# Patient Record
Sex: Male | Born: 1980 | Hispanic: Yes | Marital: Single | Smoking: Current every day smoker
Health system: Southern US, Community
[De-identification: ages and names within clinical notes are randomized; demographics above are authoritative.]

## PROBLEM LIST (undated history)

## (undated) DIAGNOSIS — M1990 Unspecified osteoarthritis, unspecified site: Secondary | ICD-10-CM

## (undated) HISTORY — DX: Unspecified osteoarthritis, unspecified site: M19.90

---

## 2013-09-16 ENCOUNTER — Ambulatory Visit: Payer: Self-pay | Admitting: Family Medicine

## 2013-09-16 VITALS — BP 148/86 | HR 73 | Temp 98.9°F | Resp 16 | Ht 65.0 in | Wt 182.0 lb

## 2013-09-16 DIAGNOSIS — L309 Dermatitis, unspecified: Secondary | ICD-10-CM

## 2013-09-16 DIAGNOSIS — L259 Unspecified contact dermatitis, unspecified cause: Secondary | ICD-10-CM

## 2013-09-16 DIAGNOSIS — M109 Gout, unspecified: Secondary | ICD-10-CM

## 2013-09-16 MED ORDER — PREDNISONE 20 MG PO TABS: ORAL_TABLET | ORAL | Status: DC

## 2013-09-16 MED ORDER — IBUPROFEN 800 MG PO TABS: 800.0000 mg | ORAL_TABLET | Freq: Three times a day (TID) | ORAL | Status: DC | PRN

## 2013-09-16 MED ORDER — OXYCODONE-ACETAMINOPHEN 5-325 MG PO TABS
1.0000 | ORAL_TABLET | Freq: Three times a day (TID) | ORAL | Status: DC | PRN
Start: 2013-09-16 — End: 2014-03-24

## 2013-09-16 MED ORDER — TRIAMCINOLONE ACETONIDE 0.1 % EX CREA: 1.0000 "application " | TOPICAL_CREAM | Freq: Two times a day (BID) | CUTANEOUS | Status: DC

## 2013-09-16 NOTE — Progress Notes (Signed)
Chief Complaint:  Chief Complaint  Patient presents with  . Rash    skin rash to right forarm and swollen left pinky for several days    HPI: Lonnie White is a 33 y.o. male who is here for 2 days of left hand swelling and pain, it is primarily in his left 5th finger. He denies any injuries. He has had similar sxs in the past on his other hand in his right hand at wrist and also fifth finger PIP. He deneis any injuries or trauma. He state he has not use anything. He works in [employer]. He has has pain in his knees as well. He denies fevers or chills, it just gets swollen and red and is painful and then eventually resolves.  He aslo has a rash on his arms that itches x 2 weeks. No new meds, travels, soaps, foods.. Denies n/w/t/ or excess ive alcohol use or shellfish intake.   Past Medical History  Diagnosis Date  . Arthritis    No past surgical history on file. History   Social History  . Marital Status: Single    Spouse Name: N/A    Number of Children: N/A  . Years of Education: N/A   Social History Main Topics  . Smoking status: Current Every Day Smoker  . Smokeless tobacco: Never Used     Comment: smokes 3 cigs per day  . Alcohol Use: No     Comment: social use  . Drug Use: No  . Sexual Activity: Not on file   Other Topics Concern  . Not on file   Social History Narrative  . No narrative on file   No family history on file. No Known Allergies Prior to Admission medications   Not on File     ROS: The patient denies fevers, chills, night sweats, unintentional weight loss, chest pain, palpitations, wheezing, dyspnea on exertion, nausea, vomiting, abdominal pain, dysuria, hematuria, melena, numbness, weakness, or tingling.   All other systems have been reviewed and were otherwise negative with the exception of those mentioned in the HPI and as above.    PHYSICAL EXAM: Filed Vitals:   09/16/13 1435  BP: 148/86  Pulse: 73  Temp: 98.9 F (37.2 C)    Resp: 16   Filed Vitals:   09/16/13 1435  Height: 5\' 5"  (1.651 m)  Weight: 182 lb (82.555 kg)   Body mass index is 30.29 kg/(m^2).  General: Alert, no acute distress HEENT:  Normocephalic, atraumatic, oropharynx patent. EOMI, PERRLA Cardiovascular:  Regular rate and rhythm, no rubs murmurs or gallops.  No Carotid bruits, radial pulse intact. No pedal edema.  Respiratory: Clear to auscultation bilaterally.  No wheezes, rales, or rhonchi.  No cyanosis, no use of accessory musculature GI: No organomegaly, abdomen is soft and non-tender, positive bowel sounds.  No masses. Skin: + eczema. Neurologic: Facial musculature symmetric. Psychiatric: Patient is appropriate throughout our interaction. Lymphatic: No cervical lymphadenopathy Musculoskeletal: Gait intact. Left hand + hand 5th finger swelling, tender, slight erythema. Decrease ROM due to pain, decrease grip strength due to swellign and pain He has fusiform swelling of the 4 and 5th DIP fingers.   LABS: No results found for this or any previous visit.   EKG/XRAY:   [facility]   ASSESSMENT/PLAN: Encounter Diagnoses  Name Primary?  . Gout Yes  . Dermatitis    Decline labs or xray since self pay so will presumptively treat for gout. He  has had similar sxs in diff parts of his body, including ankles, elbow, knees. He states they go away eventually.  He deneis any trauma,  history of RA in his family or STDs for himself Rx prednisone,roxicet, traimcinolone cream, ibuprofen prn F/u prn   Gross sideeffects, risk and benefits, and alternatives of medications d/w patient. Patient is aware that all medications have potential sideeffects and we are unable to predict every sideeffect or drug-drug interaction that may occur.  Lenell Antu, DO 09/16/2013 3:23 PM

## 2013-09-16 NOTE — Patient Instructions (Signed)
Lonnie White  °(Gout) ° Lonnie White es una artritis inflamatoria originada por Lonnie acumulación de cristales de ácido úrico en las articulaciones. El ácido úrico es una sustancia química que normalmente se encuentra en Lonnie sangre. Cuando los niveles de ácido úrico en Lonnie sangre son muy elevados, pueden formarse cristales que se depositan en las articulaciones y los tejidos. Esto causa irritación, dolor e hinchazón (inflamación). Lonnie repetición de los ataques es frecuente. Con el tiempo, los cristales de ácido úrico pueden formar masas (tofos) cerca de una articulación, destruyen el hueso y provocan una deformidad Lonnie White es una enfermedad que puede tratarse y con frecuencia puede prevenirse. °CAUSAS  °Lonnie enfermedad comienza con niveles altos de ácido úrico en Lonnie sangre. El organismo produce ácido úrico cuando metaboliza una sustancia que se encuentra en estado natural, denominada purina. Ciertos alimentos, como carnes y pescado, contienen grandes cantidades de purinas. Las causas de ácido úrico elevado son:  °· Ser transmitida de padres a hijos (hereditaria). °· Enfermedades que causan un aumento de Lonnie producción de ácido úrico (como Lonnie obesidad, Lonnie psoriasis y ciertos tipos de cáncer). °· Abuso en el consumo de bebidas alcohólicas. °· Lonnie dieta, especialmente las dietas en las que se consume mucha carne y frutos de mar. °· Ciertos medicamentos, como los que combaten el cáncer (quimioterapia), los diuréticos y Lonnie aspirina. °· Enfermedades renales crónicas. Los riñones no pueden eliminar bien el ácido úrico. °· Problemas con el metabolismo. °Las enfermedades fuertemente asociadas a Lonnie White son:  °· Obesidad. °· Hipertensión arterial. °· Colesterol elevado. °· Diabetes. °No todas las personas con niveles elevados de ácido úrico sufren Lonnie White. No se comprende aún por qué algunas personas padecen Lonnie White y otras no. Las cirugías, lesiones en una articulación y el consumo excesivo de ciertos alimentos son algunos de los factores que pueden  provocar ataques de Lonnie White.  °SÍNTOMAS  °· Un ataque de Lonnie White puede comenzar rápidamente. Causa un dolor intenso, con irritación, hinchazón y calor en una articulación. °· Puede haber fiebre. °· Generalmente sólo una articulación es afectada. Ciertas articulaciones se ven implicadas con más frecuencia. °· Lonnie base del dedo gordo del pie. °· Lonnie rodilla. °· El tobillo. °· Lonnie muñeca. °· Un dedo. °Sin tratamiento, un ataque por lo general desaparece en unos pocos días o semanas. Entre un ataque y otro, por lo general no hay síntomas, lo cual es diferente de muchas otras formas de artritis.  °DIAGNÓSTICO  °El profesional reunirá Lonnie información basándose en los síntomas y el examen físico. En algunos casos indicará estudios. Estos estudios pueden ser:  °· Análisis de sangre. °· Análisis de orina. °· Radiografías. °· Análisis de los fluidos de Lonnie articulación. En este estudio se necesita una aguja para extraer líquido de Lonnie articulación (artrocentesis). Con el uso del microscopio, se confirma Lonnie White cuando se observan los cristales de ácido úrico en el líquido de Lonnie articulación. °TRATAMIENTO  °Hay dos fases en el tratamiento de Lonnie White: el tratamiento del ataque de aparición repentina (agudo) y Lonnie prevención de los ataques (profilaxis).  °· Tratamiento de un ataque agudo. °· Se utilizan medicamentos. Se indican antiinflamatorios o corticoides. °· En algunos casos es necesario inyectar un corticoide en Lonnie articulación afectada. °· Lonnie articulación dolorosa se pone en reposo. El movimiento puede empeorar Lonnie artritis. °· Puede utilizar tanto tratamientos con calor o con frío para aliviar el dolor en las articulaciones, según lo que Lonnie White haga mejor. °· Tratamiento para prevenir ataques. °· Si sufre de ataques de Lonnie White frecuentes,   el médico puede recomendarle medicamentos preventivos. Estos medicamentos se administran después de que el ataque agudo mejora. Estos medicamentos ayudan a los riñones a eliminar el ácido úrico del  organismo o a disminuir Lonnie producción de ácido úrico. Es posible que deba utilizar estos medicamentos durante un largo tiempo. °· Lonnie primera fase del tratamiento preventiva puede asociarse con un aumento de los ataques agudos de Lonnie White. Por esta razón, durante los primeros meses de tratamiento, su médico puede también aconsejarle que tome medicamentos que habitualmente se utilizan para el tratamiento de Lonnie White aguda. Asegúrese de comprender todas las indicaciones del médico. El médico podrá hacer varios ajustes a Lonnie dosis del medicamento antes de que comiencen a ser efectivos. °· Comente el tratamiento dietético con su médico o nutricionista. El alcohol y las bebidas que contienen gran cantidad de azúcar y fructosa y los alimentos como Lonnie carne, el pollo y los frutos de mar pueden aumentar los niveles de ácido úrico. El médico o el nutricionista podrá aconsejarlo sobre las bebidas y los alimentos que debe limitar. °INSTRUCCIONES PARA EL CUIDADO EN EL HOGAR  °· No tome aspirina para el dolor. Esto eleva los niveles de ácido úrico. °· Sólo tome medicamentos de venta libre o prescriptos para calmar el dolor, las molestias o bajar Lonnie fiebre según las indicaciones de su médico. °· Haga reposo todo el tiempo que pueda. Cuando se encuentre en Lonnie cama, mantenga las sábanas y mantas alejadas de las articulaciones doloridas. °· Mantenga Lonnie articulación afectada levantada (elevada). °· Aplique compresas frías o calientes sobre las articulaciones doloridas. el uso de compresas calientes o frías depende de lo que mejor Lonnie White resulte a usted. °· Utilice muletas si Lonnie articulación que Lonnie White duele es de Lonnie pierna. °· Debe ingerir gran cantidad de líquido para mantener Lonnie orina de tono claro o color amarillo pálido. Esto ayudará a que el organismo elimine el ácido úrico. Limite el consumo de alcohol, bebidas azucaradas y que contengan fructosa. °· Siga las indicaciones con respecto a Lonnie dieta. Preste especial atención a Lonnie cantidad de  proteínas que consume. En Lonnie dieta diaria debe enfatizar el consumo de frutas, vegetales, granos enteros y productos lácteos descremados. Comente con su médico o nutricionista acerca del consumo de café, vitamina C o cerezas. Estos pueden ayudar a disminuir los niveles de ácido úrico. °· Mantenga un peso corporal adecuado. °SOLICITE ATENCIÓN MÉDICA SI:  °· Tiene diarrea, vómitos o algún efecto secundario provocado por los medicamentos. °· No se siente mejor en 24 horas, o empeora. °SOLICITE ATENCIÓN MÉDICA DE INMEDIATO SI:  °· Las articulaciones Hipolito Martinezlopez duelen más de manera repentina, tiene escalofríos o fiebre. °ASEGÚRESE DE QUE:  °· Comprende estas instrucciones. °· Controlará su enfermedad. °· Solicitará ayuda de inmediato si no mejora o si empeora. °Document Released: 01/14/2005 Document Revised: 08/01/2012 °[employer]® Patient Information ©2014 [employer]. ° °

## 2014-03-24 ENCOUNTER — Ambulatory Visit (INDEPENDENT_AMBULATORY_CARE_PROVIDER_SITE_OTHER): Payer: Self-pay

## 2014-03-24 ENCOUNTER — Ambulatory Visit (INDEPENDENT_AMBULATORY_CARE_PROVIDER_SITE_OTHER): Payer: Self-pay | Admitting: Family Medicine

## 2014-03-24 VITALS — BP 132/80 | HR 70 | Temp 97.9°F | Resp 16 | Ht 66.5 in | Wt 185.0 lb

## 2014-03-24 DIAGNOSIS — S86811A Strain of other muscle(s) and tendon(s) at lower leg level, right leg, initial encounter: Secondary | ICD-10-CM

## 2014-03-24 DIAGNOSIS — M25561 Pain in right knee: Secondary | ICD-10-CM

## 2014-03-24 DIAGNOSIS — S86911A Strain of unspecified muscle(s) and tendon(s) at lower leg level, right leg, initial encounter: Secondary | ICD-10-CM

## 2014-03-24 MED ORDER — HYDROCODONE-ACETAMINOPHEN 5-325 MG PO TABS: 1.0000 | ORAL_TABLET | Freq: Four times a day (QID) | ORAL | Status: AC | PRN

## 2014-03-24 MED ORDER — NAPROXEN 500 MG PO TABS: 500.0000 mg | ORAL_TABLET | Freq: Two times a day (BID) | ORAL | Status: AC

## 2014-03-24 NOTE — Progress Notes (Addendum)
Subjective:    Patient ID: Lonnie White, male    DOB: 02/04/1981, 33 y.o.   MRN: [id number]  HPI Chief Complaint  Patient presents with   Knee Pain    right knee;  x2 days   Medication Refill   Naproxen, Hydrocodone,   This chart was scribed for Nilda SimmerKristi Smith, MD by Andrew Auaven Small, ED Scribe. This patient was seen in room 9 and the patient's care was started at 11:03 AM.  HPI Comments: Lonnie White is a 33 y.o. male who presents to the [facility] complaining of right knee pain that began 1 day ago. Pt reports he has pain with elevated uric acid levels/gout usually in his right knee, left knee or left shoulder. Pt states he was taking medication for elevated uric acid but states he has been out of his medication for 3 months. He reports levels were last checked 3 months ago in [place]. Pt reports popping in his knee with rest, the knee giving out, and limping with walking.  He reports worsening pain with walking. He reports he's had a negative right knee XRay in the past.  Pt was prescribed colchicine and naproxen 9/11 and 9/13. Pt denies exercising.  Pt walks up and down a tall ladder multiple times per day at work; this worsens knee pain.   Pt works [employer]. Pt denies other health problems. He denies surgeries.   History reviewed. No pertinent past medical history. No Known Allergies Prior to Admission medications   Medication Sig Start Date End Date Taking? Authorizing Provider  colchicine 0.6 MG tablet Take 0.6 mg by mouth daily.   Yes Historical Provider, MD  HYDROcodone-acetaminophen (NORCO/VICODIN) 5-325 MG per tablet Take 1 tablet by mouth every 6 (six) hours as needed for moderate pain.   Yes Historical Provider, MD  naproxen (NAPROSYN) 500 MG tablet Take 500 mg by mouth 2 (two) times daily with a meal.   Yes Historical Provider, MD   Review of Systems  Constitutional: Negative for fever, chills, diaphoresis and fatigue.  Musculoskeletal:  Positive for arthralgias and gait problem. Negative for myalgias and joint swelling.  Skin: Negative for color change, pallor, rash and wound.  Neurological: Negative for weakness and numbness.       Objective:   Physical Exam  Constitutional: He is oriented to person, place, and time. He appears well-developed and well-nourished. No distress.  HENT:  Head: Normocephalic and atraumatic.  Eyes: Conjunctivae and EOM are normal. Pupils are equal, round, and reactive to light.  Musculoskeletal: Normal range of motion.       Right shoulder: He exhibits tenderness and bony tenderness. He exhibits normal range of motion, no swelling, no effusion, no pain, normal pulse and normal strength.       Right knee: He exhibits no effusion and no erythema. Tenderness found. Lateral joint line tenderness noted.  Right knee without effusion, warmth or erythema. Full flexion and extension. TTP lateral joint line. Mcmurray negative. valgus and varus intact. Pt walks with a limp.  Lachman's negative.  Neurological: He is alert and oriented to person, place, and time.  Skin: Skin is warm and dry. No rash noted. No erythema.  Psychiatric: He has a normal mood and affect. His behavior is normal.  Nursing note and vitals reviewed.   [facility] reading (PRIMARY) by  Dr. Katrinka BlazingSmith.  R KNEE: PATELLAR SPURRING.       Assessment & Plan:  Right knee pain - Plan: DG Knee Complete 4 Views  Right, CANCELED: DG Knee 1-2 Views Right  Knee strain, right, initial encounter   1. R lateral knee pain/strain: New.  Current symptoms not suggestive of gout but instead of acute strain.  Rx for Naproxen 500mg  bid provided; rx for hydrocodone provided. If no improvement in two weeks, call for ortho referral.  Home exercise program provided.  Recommend rest, ice, elevation.    Meds ordered this encounter  Medications   DISCONTD: naproxen (NAPROSYN) 500 MG tablet    Sig: Take 500 mg by mouth 2 (two) times daily with a meal.    DISCONTD: HYDROcodone-acetaminophen (NORCO/VICODIN) 5-325 MG per tablet    Sig: Take 1 tablet by mouth every 6 (six) hours as needed for moderate pain.   colchicine 0.6 MG tablet    Sig: Take 0.6 mg by mouth daily.   naproxen (NAPROSYN) 500 MG tablet    Sig: Take 1 tablet (500 mg total) by mouth 2 (two) times daily with a meal.    Dispense:  60 tablet    Refill:  2   HYDROcodone-acetaminophen (NORCO/VICODIN) 5-325 MG per tablet    Sig: Take 1 tablet by mouth every 6 (six) hours as needed for moderate pain.    Dispense:  30 tablet    Refill:  0   I personally performed the services described in this documentation, which was scribed in my presence. The recorded information has been reviewed and considered.   Nilda SimmerKristi Smith, M.D.  [facility]   [place] [address] [place], [place]  [id number] [phone] phone [phone] fax

## 2014-03-24 NOTE — Patient Instructions (Signed)
Fractura de meniscos con rehabilitacin fase I (Meniscus Tear with Phase I Rehab) El menisco es un trozo de TEFL teachercartlago con forma de C, ubicado en la articulacin de la rodilla, entre la tibia y Writerel fmur. Hay dos meniscos en cada articulacin de la rodilla: interno y externo. El menisco acta como un adaptador entre la tibia y el fmur y permite que los huesos de la tibia y el fmur se ensamblen. Tambin funciona como amortiguador, para reducir el estrs sobre la articulacin de la rodilla y ayudar a suministrar nutrientes al cartlago de Nurse, learning disabilityla articulacin. Cuando las Lockheed Martinpersonas envejecen, los meniscos se endurecen y se vuelven ms vulnerables a las lesiones. Es una lesin frecuente, especialmente en los deportistas de Firebaughedad avanzada. La fractura de menisco interno es ms comn que la del externo.  SNTOMAS  Dolor en la rodilla, especialmente al permanecer parado sobre la pierna afectada.  Sensibilidad en la lnea de la articulacin.  Inflamacin en la articulacin de la rodilla (efusin), que por lo general comienza entre 1 y 2 [address]  [address] despus de la lesin.  La rodilla se traba, o se queda atrapada la articulacin, lo que ocasiona que no se pueda estirar por completo.  La rodilla se tuerce o se afloja. CAUSAS La ruptura se produce cuando se aplica una fuerza mayor que la que puede soportar el menisco. Las causas ms frecuentes de la lesin son:  Golpe directo en la rodilla (traumatismo).  Golpe directo en la rodilla, girar, pivotar o cortar (cambiar rpidamente de direccin al correr), as Brayton Marscomo al arrodillarse o ponerse en cuclillas.  Sin traumatismo, por un proceso de envejecimiento. LOS RIESGOS AUMENTAN CON:  En los deportes de contacto (ftbol americano, rugby).  Los deportes en los cuales se requiere Comptrollercalzado especial (ftbol, lacrosse),o en los que se requieren tapones grandes y que requieren cambios bruscos de direccin (squash, deportes con raqueta, bsquet).  Lesin previa en la  rodilla.  Lesin asociada en la rodilla, en particular lesiones en los ligamentos.  Poca fuerza y flexibilidad. PREVENCIN   Precalentamiento adecuado y elongacin antes de la [place] Heightsactividad.  Mantener la forma fsica:  Earma ReadingFuerza, flexibilidad y resistencia muscular.  Capacidad cardiovascular.  Proteja la rodilla con dispositivos ortopdicos o vendajes compresivos.  Utilice el equipo adecuado (tamao adecuado de los tapones segn la superficie). PRONSTICO Estas fracturas a menudo se curan por s solas. Sin embargo, a menudo requiere Azerbaijanciruga y 6 meses de recuperacin.  COMPLICACIONES RELACIONADAS  La recurrencia de los sntomas puede dar como resultado un problema crnico.  Traumatismo repetido de la rodilla, en especial si se retoma el deporte rpidamente luego de la lesin o la ciruga.  Progresin del desgarro (se agranda) si no se trata.  Artritis en la rodilla luego de algunos aos (con o sin Azerbaijanciruga).  Complicaciones en la ciruga que incluyen infeccin, hemorragia, lesiones a los nervios (adormecimiento, debilitamiento, parlisis) dolor continuo, se tuerce o se traba la rodilla, el menisco no se cura (si fue reparado), necesidad de [place] nueva ciruga, y rigidez de la rodilla (prdida de movimiento). TRATAMIENTO El tratamiento inicial incluye el uso de medicamentos y la aplicacin de hielo para reducir Chief Technology Officerel dolor y la inflamacin. La utilizacin de Murphy Oilmuletas podr ser de Blairutilidad. Sin embargo Careers information officerpuede sostener peso con la pierna afectada, si el dolor se lo permite. La ciruga se recomienda a menudo como tratamiento definitivo. La ciruga suele realizarse a travs de una incisin cerca de la articulacin (artroscopa). Se elimina la parte desgarrada del menisco y si es posible se repara  el cartlago. Luego de la [place], se inmoviliza la articulacin. Luego de la inmovilizacin es importante completar los ejercicios de fortalecimiento y elongacin para recobrar la fuerza y la amplitud de  movimientos. Los ejercicios pueden Press photographer o con un terapeuta.  MEDICAMENTOS   Si es necesaria la administracin de medicamentos para Conservation officer, historic buildings, se recomiendan los antiinflamatorios no esteroides, como aspirina e ibuprofeno y otros calmantes menores, como acetaminofeno.  No tome medicamentos para el dolor dentro de los 7 das previos a la [place].  El profesional podr prescribirle calmantes si lo considera necesario. Utilcelos como se le indique y slo cuando lo necesite. CALOR Y FRO   El fro (con hielo) debe aplicarse durante 10 a 15 minutos cada 2  3 horas para reducir la inflamacin y Conservation officer, historic buildings e inmediatamente despus de cualquier actividad que agrava los sntomas. Utilice bolsas o un masaje de hielo.  El calor puede usarse antes de Neurosurgeon y [place] fortalecimiento indicadas por el profesional, le fisioterapeuta o Industrial/product designer. Utilice una bolsa trmica o un pao hmedo. SOLICITE ATENCIN MDICA SI:  Los sntomas empeoran o no mejoran en 2 semanas, a pesar de Chiropodist.  Desarrolla nuevos e inexplicables sntomas. (Los medicamentos indicados en el tratamiento le ocasionan efectos secundarios). [place]ield del menisco, no quirrgico, Fase I Estos son algunos ejercicios iniciales con los que puede comenzar el programa de rehabilitacin hasta que vea nuevamente al profesional que lo asiste o hasta que los sntomas hayan desaparecido. Recuerde:   Los ejercicios iniciales deben ser suaves. Le ayudarn a reestablecer el movimiento sin aumentar la hinchazn.  Al completar estos ejercicios podr realizar movimientos con menos dolor y estar preparado para ejercicios de fortalecimiento ms agresivos de la Fase II.  Para que sea efectiva, cada elongacin debe realizarse durante al menos 30 segundos.  La elongacin nunca debe ser dolorosa. Deber sentir slo un alargamiento o distensin  suave del tejido que estira. [place], activo  Recustese sobre la espalda con las rodillas rectas. (Si esto le ocasiona molestias en la espalda, doble la rodilla opuesta, apoyando el pie en el suelo).  Lentamente deslice el taln hacia sus nalgas, hasta que sienta una sensacin de estiramiento en la zona de la rodilla o el muslo.  Mantenga esta posicin durante __________ segundos. Vuelva lentamente el taln hasta la posicin inicial. Reptalo __________ veces. Realice este ejercicio __________ veces por da.  [place] y extensin de la rodilla, [place], asistida  Sintese en el borde de una mesa o una silla, con los muslos bien apoyados. Puede ser de utilidad colocar una toalla doblada debajo de su muslo derecha / izquierdo.  Flexin (doblar): Coloque el tobillo de su pierna sana sobre el otro tobillo. Use su pierna sana para doblar suavemente su rodilla derecha / izquierdo hasta sentir una suave tensin que cruce sobre su rodilla.  Mantenga esta posicin durante __________ segundos.  Extensin (elongacin): [place] posicin de los tobillos de modo que la pierna Recruitment consultant / izquierdo [place]. Use su pierna sana para doblar suavemente su rodilla derecha / izquierdo hasta sentir una suave tensin que cruce sobre su rodilla.  Mantenga esta posicin durante __________ segundos. Reptalo __________ veces. Realice este ejercicio __________ veces por da. ELONGACIN - Flexin de la rodilla posicin supina  Acustese en el suelo, con el taln derecha / izquierdo tocando ligeramente la pared. (Coloque ambos  pies en la pared, si no utiliza un marco de la puerta.)  Sin hacer esfuerzo, permita que la gravedad haga descender su pie por la pared lentamente hasta sentir un ligero estiramiento en la zona anterior de la rodilla derecha / izquierdo.  Mantenga esta posicin durante __________ segundos. Luego vuelva la pierna a la posicin  inicial usando su pierna sana, si es necesario. Reptalo __________ veces. Realice este estiramiento __________ Vicenta Aly por da.  ELONGACIN - Extensin de la rodilla en posicin sentado  Sintese con su pierna o taln derecha / izquierdo apoyado en otra silla, en una mesita de caf o en un posapis.  Relaje los msculos de la pierna, y permita que la gravedad enderece su rodilla.*  Debe sentir un estiramiento en la rodilla derecha / izquierdo. Mantenga esta posicicin durante __________ segundos. Reptalo __________ veces. Realice este estiramiento __________ Vicenta Aly por da.  *El mdico, fisioterapeuta o Administrator, sports, podrn indicarle que aada __________ Marlyne Beards de resistencia mediante la colocacin de un objeto pesado sobre el muslo, por encima de rtula para profundizar el estiramiento.  EJERCICIOS DE FORTALECIMIENTO - Fractura de meniscos, no quirrgico Fase I Estos ejercicios le ayudarn en la recuperacin de la lesin. Los sntomas podrn desaparecer con o sin mayor intervencin del profesional, el fisioterapeuta o Industrial/product designer. Al completar estos ejercicios, recuerde:   Los msculos pueden ganar la resistencia y la fuerza necesarias para las actividades diarias a travs de ejercicios controlados.  Realice los ejercicios como se lo indic el mdico, el fisioterapeuta o Industrial/product designer. Aumente la resistencia y las repeticiones segn se le haya indicado. FUERZA - Cudriceps, isomtricos  Recustese sobre la espalda con la pierna derecha / izquierdo extendida y la rodilla opuesta flexionada.  Tensione gradualmente los msculos de la zona anterior del muslo derecha / izquierdo. Ver que la rtula se desliza hacia arriba o que se intensifica el hoyuelo que se encuentra justo por arriba de la rodilla. Este movimiento empujar nuevamente la rodilla hacia abajo en direccin al piso, [place] o cama sobre la cual est recostado.  Sostenga de ese modo el msculo, tan apretado como pueda, sin que [employer], durante __________ segundos.  Relaje los msculos lenta y completamente entre cada repeticin. Reptalo __________ veces. Realice este ejercicio __________ veces por da.  FUERZA - Cudriceps, arcos cortos  Acustese [employer] Coloque un rollo de __________ cm. debajo de la rodilla derecha / izquierdo para que las rodillas se doblen levemente.  Eleve slo la parte inferior de la pierna mediante la tensin de los msculos de la parte frontal del muslo. No permita que el muslo se eleve.  Mantenga esta posicicin durante __________ segundos. Reptalo __________ veces. Realice este ejercicio __________ veces por da.  PESO OPCIONAL EN EL TOBILLO: Comience con ____________________, pero no se exceda de ____________________. Aumente de a 1 libra/0.5 kilograme.  Roland piernas rectas Lo que importa es la calidad! Observe si hay seales de que el msculo cudriceps est trabajando, para estar seguro de que trabaja el fortalecimiento de los msculos correctos y no "haga trampa" mediante la sustitucin de los msculos sanos.  Recustese sobre la espalda con su pierna derecha / izquierdo extendida y la rodilla opuesta doblada.  Tensione gradualmente los msculos de la zona anterior del muslo derecha / izquierdo. Ver que la rtula se desliza hacia arriba o que se intensifica el hoyuelo que se encuentra justo por arriba de la rodilla. El muslo podr temblar un poco.  Tensione  estos msculos an ms y Borders Groupeleve la pierna 10 a 15 cm del piso. Mantenga esta posicin durante __________ segundos.  Mientras mantiene estos msculos tensionados, baje la pierna.  Relaje los msculos lenta y completamente entre cada repeticin. Reptalo __________ veces. Realice este ejercicio __________ veces por da.  FUERZA - Isquiosurales - giros  Acustese sobre el estmago con las piernas extendidas. (Si est recostado Whole Foodssobre la cama, puede dejar que los pies cuelguen por el  borde).  Contraiga los msculos de la zona posterior del muslo para doblar su rodilla derecha / izquierdo a 90 grados. Mantenga las caderas planas sobre la cama.  Mantenga esta posicicin durante __________ segundos.  Baje lentamente la pierna hasta la posicin inicial. Reptalo __________ veces. Realice este ejercicio __________ veces por da.  FUERZA - Cudriceps, cuclillas  Prese en el marco de la puerta, de modo que los pies y las rodillas queden en lnea con el marco.  Coloque las manos para mantener el equilibrio en el [place]marco, pero no se apoye.  Bjese lentamente, doblando las caderas y las rodillas. Mantenga las piernas rectas de modo que queden paralelas al marco de la puerta. Pngase en cuclillas slo dentro del lmite en que no sienta dolor en la rodilla. Nunca deje que las caderas lleguen a un nivel inferior de las rodillas.  Vuelva lentamente a la posicin erguida, empujando con las piernas y no con las manos Reptalo __________ Darden Restaurantsveces. Realice este ejercicio __________ veces por da.  FUERZA - Isometra cudriceps vasto medial oblicuo  Sintese en una silla con su rodilla derecha / izquierdo ligeramente doblada. Con la punta de los dedos sienta el vasto medial oblicuo arriba de la zona interna de la rodilla. Este msculo interviene en el control de la posicin de la rtula.  Mantenga la punta de los dedos Sears Holdings Corporationsobre este msculo. Sin mover la pierna, intente llevar la rodilla hacia abajo como si extendiera la pierna. Debe sentir que el vasto medial oblicuo se tensa. Si le resulta difcil, trate primero de hacer el mismo ejercicio con la rodilla sana.  Tense este msculo tanto como pueda sin aumentar el dolor en la rodilla.  Mantenga esta posicin durante __________ segundos. Relaje los msculos lenta y completamente entre cada repeticin. Reptalo __________ veces. Realice este ejercicio __________ veces por da.  Document Released: 01/21/2006 Document Revised:  06/29/2011 [facility]roeExitCare Patient Information 2015 [employer], Maryland[place]. This information is not intended to replace advice given to you by your health care provider. Make sure you discuss any questions you have with your health care provider.

## 2015-05-25 IMAGING — CR DG KNEE COMPLETE 4+V*R*
4 series · 4 of 4 positions shown · non-contrast
Comparison: No priors.

CLINICAL DATA: 33-year-old male presenting with right-sided knee
pain.

EXAM:
RIGHT KNEE - COMPLETE 4+ VIEW

[AP]
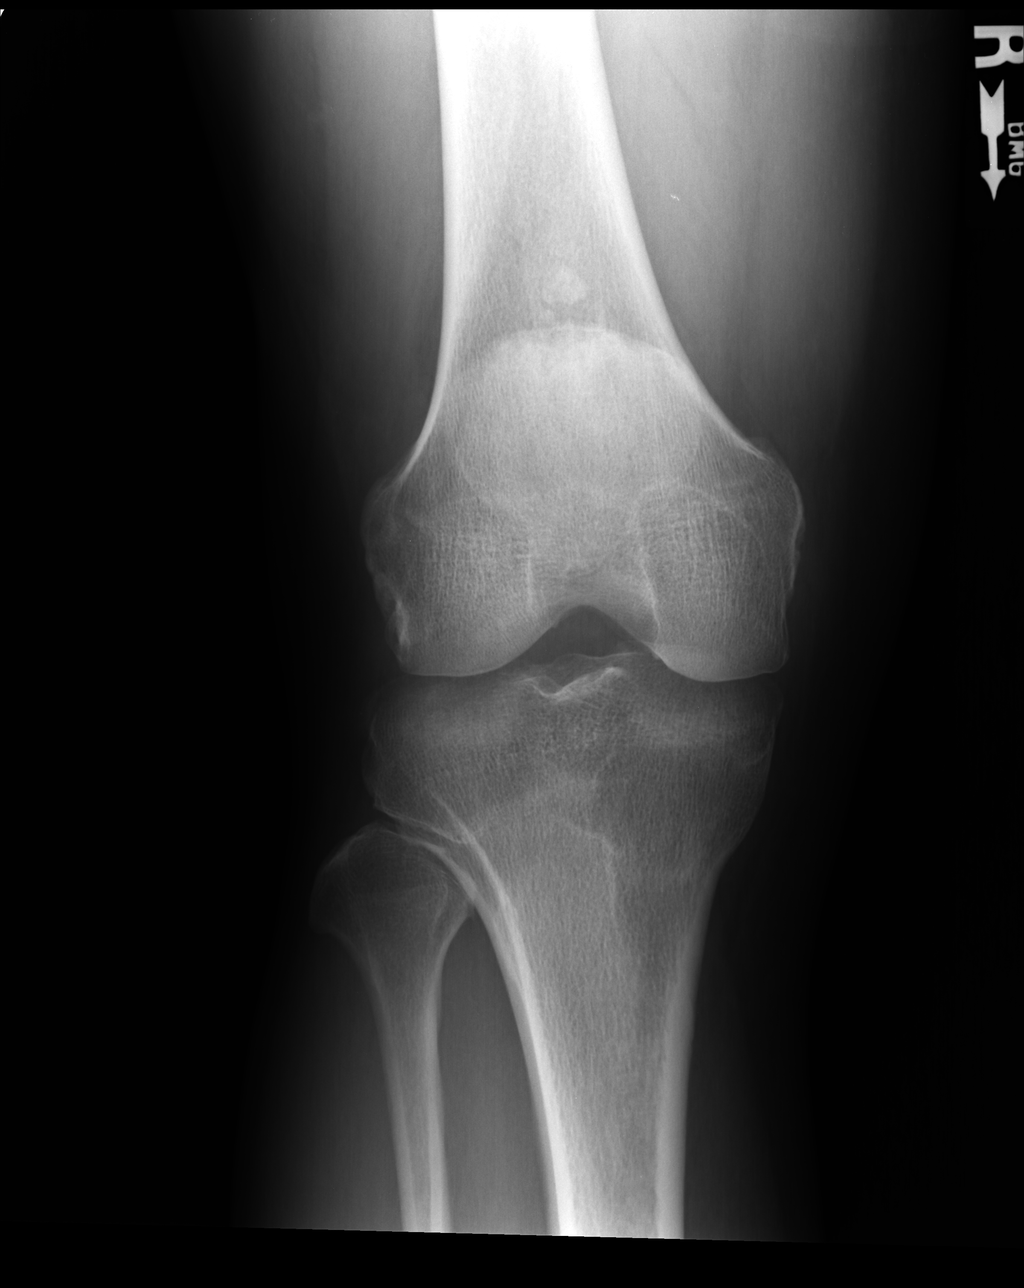

[ap axial]
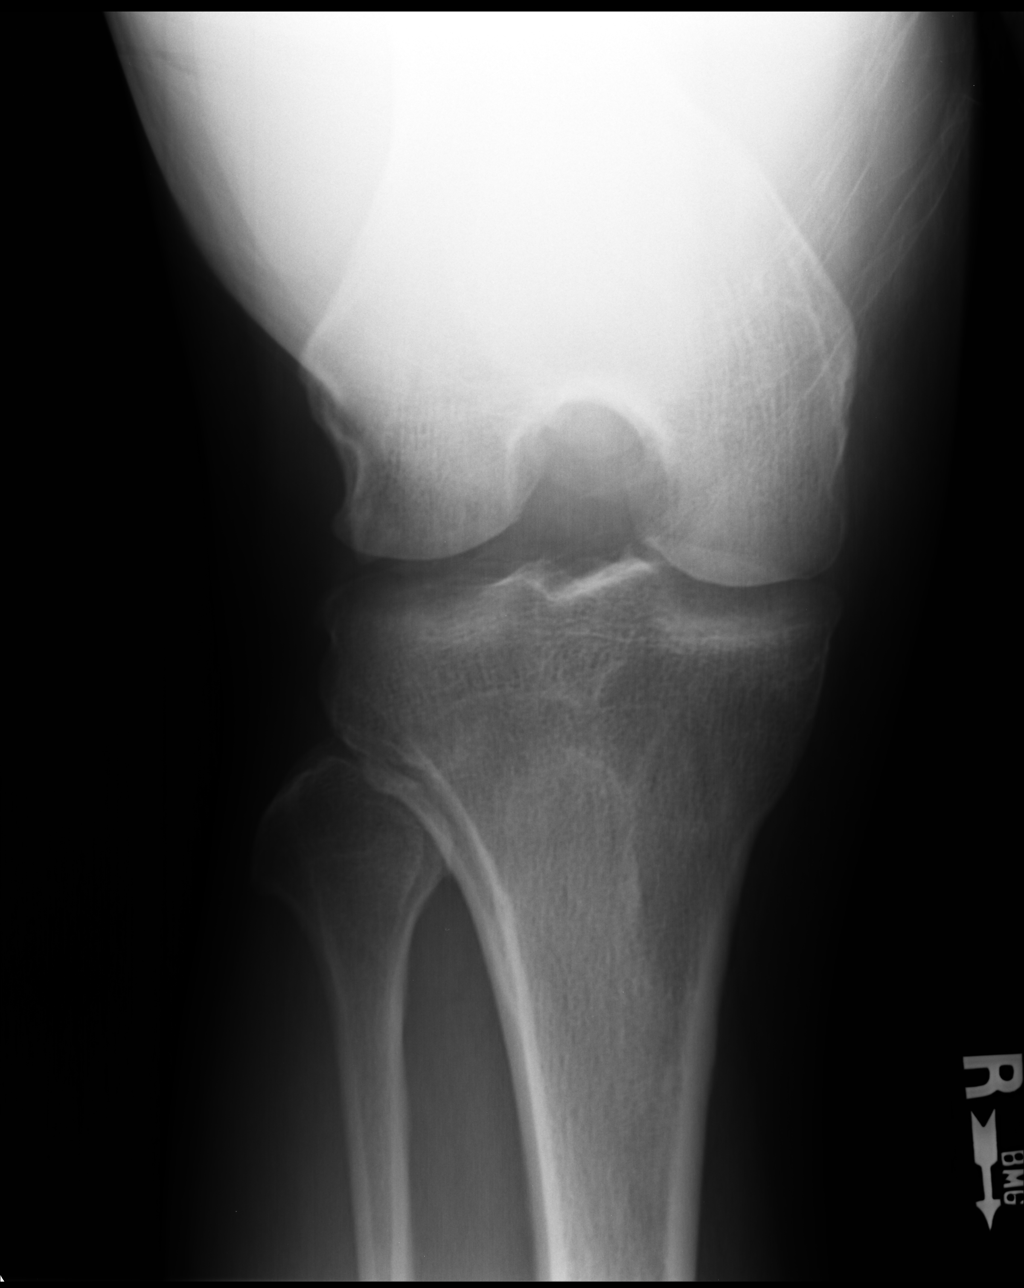

[lateral]
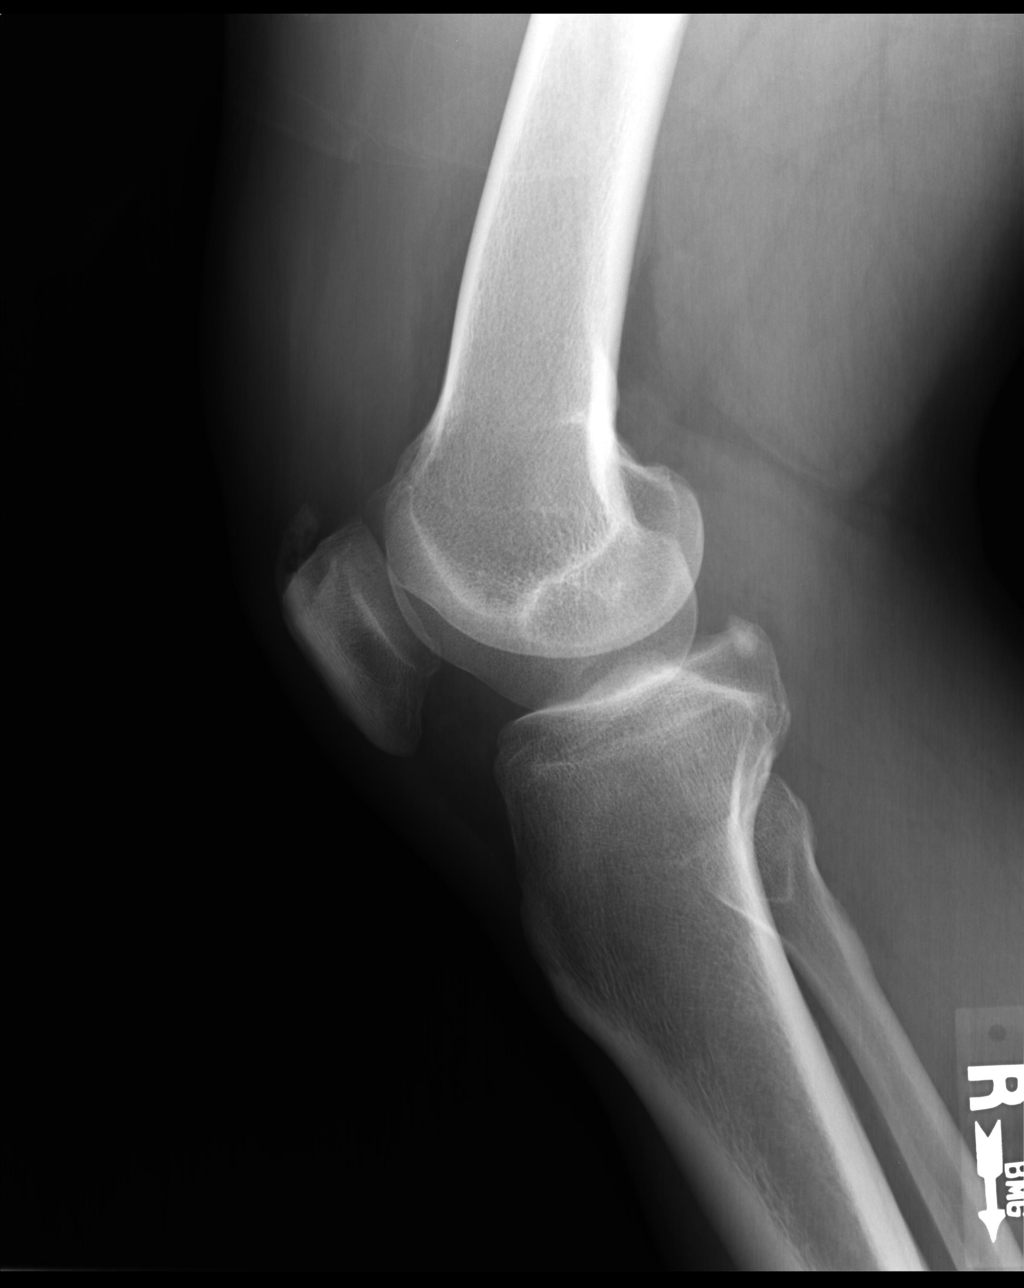

[sunrise]
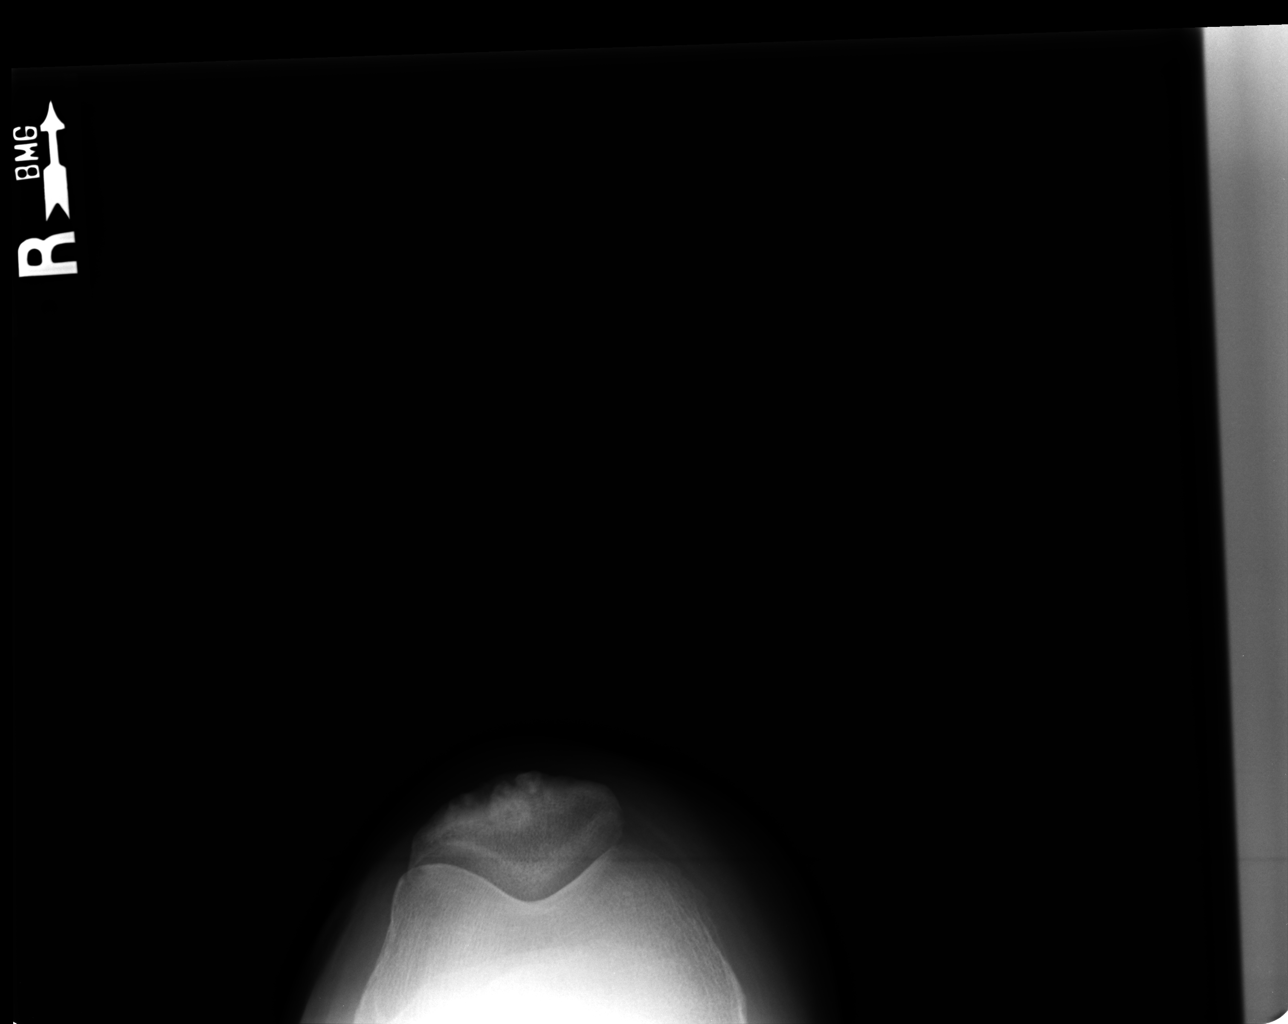

[4 of 4 positions shown; findings below may reference images not displayed]

FINDINGS: Small enthesophyte off the superior aspect of the patella. No acute
displaced fracture, subluxation, dislocation, or soft tissue
abnormality.
IMPRESSION: No acute radiographic abnormality of the right knee.
